# Patient Record
Sex: Male | Born: 1955 | Race: White | Hispanic: No | Marital: Married | Smoking: Current every day smoker
Health system: Southern US, Community
[De-identification: ages and names within clinical notes are randomized; demographics above are authoritative.]

## PROBLEM LIST (undated history)

## (undated) DIAGNOSIS — Z8639 Personal history of other endocrine, nutritional and metabolic disease: Secondary | ICD-10-CM

## (undated) DIAGNOSIS — I890 Lymphedema, not elsewhere classified: Secondary | ICD-10-CM

## (undated) DIAGNOSIS — M545 Low back pain, unspecified: Secondary | ICD-10-CM

## (undated) DIAGNOSIS — E119 Type 2 diabetes mellitus without complications: Secondary | ICD-10-CM

## (undated) DIAGNOSIS — Z720 Tobacco use: Secondary | ICD-10-CM

## (undated) DIAGNOSIS — G8929 Other chronic pain: Secondary | ICD-10-CM

## (undated) DIAGNOSIS — E538 Deficiency of other specified B group vitamins: Secondary | ICD-10-CM

## (undated) HISTORY — DX: Morbid (severe) obesity due to excess calories: E66.01

## (undated) HISTORY — DX: Low back pain, unspecified: M54.50

## (undated) HISTORY — DX: Deficiency of other specified B group vitamins: E53.8

## (undated) HISTORY — DX: Other chronic pain: G89.29

## (undated) HISTORY — PX: WRIST SURGERY: SHX841

## (undated) HISTORY — PX: NECK SURGERY: SHX720

## (undated) HISTORY — DX: Type 2 diabetes mellitus without complications: E11.9

## (undated) HISTORY — DX: Tobacco use: Z72.0

## (undated) HISTORY — DX: Personal history of other endocrine, nutritional and metabolic disease: Z86.39

## (undated) HISTORY — DX: Low back pain: M54.5

---

## 2014-12-12 ENCOUNTER — Other Ambulatory Visit: Payer: Self-pay | Admitting: Family Medicine

## 2014-12-12 ENCOUNTER — Ambulatory Visit
Admission: RE | Admit: 2014-12-12 | Discharge: 2014-12-12 | Disposition: A | Discharge status: Home / Self Care | Payer: No Typology Code available for payment source | Source: Ambulatory Visit | Attending: Family Medicine | Admitting: Family Medicine

## 2014-12-12 DIAGNOSIS — M545 Low back pain, unspecified: Secondary | ICD-10-CM | POA: Insufficient documentation

## 2014-12-12 DIAGNOSIS — G8929 Other chronic pain: Secondary | ICD-10-CM | POA: Insufficient documentation

## 2014-12-12 DIAGNOSIS — R5383 Other fatigue: Secondary | ICD-10-CM | POA: Insufficient documentation

## 2014-12-15 DIAGNOSIS — E538 Deficiency of other specified B group vitamins: Secondary | ICD-10-CM | POA: Insufficient documentation

## 2014-12-15 DIAGNOSIS — E119 Type 2 diabetes mellitus without complications: Secondary | ICD-10-CM | POA: Insufficient documentation

## 2014-12-31 DIAGNOSIS — G5600 Carpal tunnel syndrome, unspecified upper limb: Secondary | ICD-10-CM | POA: Insufficient documentation

## 2015-01-01 ENCOUNTER — Encounter: Payer: No Typology Code available for payment source | Attending: Family Medicine | Admitting: Dietician

## 2015-01-01 ENCOUNTER — Encounter: Payer: Self-pay | Admitting: Dietician

## 2015-01-01 VITALS — BP 110/70 | Ht 69.0 in | Wt 317.3 lb

## 2015-01-01 DIAGNOSIS — F17200 Nicotine dependence, unspecified, uncomplicated: Secondary | ICD-10-CM | POA: Insufficient documentation

## 2015-01-01 DIAGNOSIS — E119 Type 2 diabetes mellitus without complications: Secondary | ICD-10-CM | POA: Diagnosis not present

## 2015-01-01 NOTE — Progress Notes (Signed)
Patient in for refresher program after new diagnosis of Type 2 DM. Patient states he would like to lose weight to better control BGs and prevent other health issues. Instructed pt on general diet guidelines for Diabetes, and basic meal planning using plate method and food models to illustrate balanced meals.  Instructed on goals for BGs and HbA1C.  Discussed effects of elevated BGs on nerve health and circulation. Also discussed effects of smoking, and advised pt to work to quit smoking. He states he would like to quit, but needs to work on one thing at a time.

## 2015-01-01 NOTE — Patient Instructions (Signed)
Eat a meal midday -- a dinner meal, and small supper; or at least a small lunch midday like fruit and peanuts or 1 sandwich with lean meat or peanut butter.  Try white wheat bread.  Control food portions by using smaller plates; eat large portions of vegetables and small portions of meats and starchy foods.  Include protein with all meals -- eggs, lean meat (mostly chicken, [place] or fish), low fat cheese, or peanut butter.

## 2015-01-09 ENCOUNTER — Other Ambulatory Visit: Payer: Self-pay | Admitting: Family Medicine

## 2015-01-09 ENCOUNTER — Ambulatory Visit: Payer: Self-pay | Admitting: Family Medicine

## 2015-01-09 DIAGNOSIS — M545 Low back pain: Secondary | ICD-10-CM

## 2015-01-09 DIAGNOSIS — M79604 Pain in right leg: Secondary | ICD-10-CM

## 2015-01-16 ENCOUNTER — Ambulatory Visit: Payer: No Typology Code available for payment source

## 2015-01-20 ENCOUNTER — Ambulatory Visit
Admission: RE | Admit: 2015-01-20 | Discharge: 2015-01-20 | Disposition: A | Discharge status: Home / Self Care | Payer: No Typology Code available for payment source | Source: Ambulatory Visit | Attending: Family Medicine | Admitting: Family Medicine

## 2015-01-20 DIAGNOSIS — M545 Low back pain: Secondary | ICD-10-CM | POA: Insufficient documentation

## 2015-01-20 DIAGNOSIS — R262 Difficulty in walking, not elsewhere classified: Secondary | ICD-10-CM | POA: Insufficient documentation

## 2015-01-20 DIAGNOSIS — M6281 Muscle weakness (generalized): Secondary | ICD-10-CM | POA: Diagnosis not present

## 2015-01-20 DIAGNOSIS — M79604 Pain in right leg: Secondary | ICD-10-CM

## 2015-01-20 DIAGNOSIS — M79605 Pain in left leg: Secondary | ICD-10-CM

## 2015-02-05 ENCOUNTER — Ambulatory Visit: Payer: No Typology Code available for payment source | Admitting: Dietician

## 2015-02-05 ENCOUNTER — Encounter: Payer: Self-pay | Admitting: Dietician

## 2015-12-11 ENCOUNTER — Ambulatory Visit (INDEPENDENT_AMBULATORY_CARE_PROVIDER_SITE_OTHER): Payer: BLUE CROSS/BLUE SHIELD | Admitting: Family Medicine

## 2015-12-11 ENCOUNTER — Encounter: Payer: Self-pay | Admitting: Family Medicine

## 2015-12-11 VITALS — BP 130/60 | HR 72 | Temp 98.3°F | Ht 69.0 in | Wt 313.4 lb

## 2015-12-11 DIAGNOSIS — R6889 Other general symptoms and signs: Secondary | ICD-10-CM

## 2015-12-11 DIAGNOSIS — M545 Low back pain, unspecified: Secondary | ICD-10-CM

## 2015-12-11 DIAGNOSIS — Z0001 Encounter for general adult medical examination with abnormal findings: Secondary | ICD-10-CM

## 2015-12-11 DIAGNOSIS — E559 Vitamin D deficiency, unspecified: Secondary | ICD-10-CM

## 2015-12-11 DIAGNOSIS — Z6841 Body Mass Index (BMI) 40.0 and over, adult: Secondary | ICD-10-CM

## 2015-12-11 DIAGNOSIS — E538 Deficiency of other specified B group vitamins: Secondary | ICD-10-CM | POA: Diagnosis not present

## 2015-12-11 DIAGNOSIS — J449 Chronic obstructive pulmonary disease, unspecified: Secondary | ICD-10-CM | POA: Diagnosis not present

## 2015-12-11 DIAGNOSIS — E119 Type 2 diabetes mellitus without complications: Secondary | ICD-10-CM | POA: Diagnosis not present

## 2015-12-11 DIAGNOSIS — G8929 Other chronic pain: Secondary | ICD-10-CM | POA: Diagnosis not present

## 2015-12-11 LAB — COMPREHENSIVE METABOLIC PANEL
ALBUMIN: 4 g/dL (ref 3.5–5.2)
ALT: 20 U/L (ref 0–53)
AST: 19 U/L (ref 0–37)
Alkaline Phosphatase: 54 U/L (ref 39–117)
BUN: 20 mg/dL (ref 6–23)
CHLORIDE: 102 meq/L (ref 96–112)
CO2: 27 mEq/L (ref 19–32)
CREATININE: 0.83 mg/dL (ref 0.40–1.50)
Calcium: 9 mg/dL (ref 8.4–10.5)
GFR: 101.01 mL/min (ref >60.00)
Glucose, Bld: 130 mg/dL — ABNORMAL HIGH (ref 70–99)
Potassium: 3.8 mEq/L (ref 3.5–5.1)
SODIUM: 138 meq/L (ref 135–145)
TOTAL PROTEIN: 6.8 g/dL (ref 6.0–8.3)
Total Bilirubin: 0.5 mg/dL (ref 0.2–1.2)

## 2015-12-11 LAB — LIPID PANEL
CHOLESTEROL: 140 mg/dL (ref 0–200)
HDL: 27.2 mg/dL — ABNORMAL LOW (ref >39.00)
LDL CALC: 74 mg/dL (ref 0–99)
NonHDL: 112.3
Total CHOL/HDL Ratio: 5
Triglycerides: 190 mg/dL — ABNORMAL HIGH (ref 0.0–149.0)
VLDL: 38 mg/dL (ref 0.0–40.0)

## 2015-12-11 LAB — CBC
HCT: 45.3 % (ref 39.0–52.0)
Hemoglobin: 15.1 g/dL (ref 13.0–17.0)
MCHC: 33.3 g/dL (ref 30.0–36.0)
MCV: 93 fl (ref 78.0–100.0)
Platelets: 212 10*3/uL (ref 150.0–400.0)
RBC: 4.87 Mil/uL (ref 4.22–5.81)
RDW: 13.2 % (ref 11.5–15.5)
WBC: 8.6 10*3/uL (ref 4.0–10.5)

## 2015-12-11 LAB — MICROALBUMIN / CREATININE URINE RATIO
CREATININE, U: 229.6 mg/dL
Microalb Creat Ratio: 0.6 mg/g (ref 0.0–30.0)
Microalb, Ur: 1.4 mg/dL (ref 0.0–1.9)

## 2015-12-11 LAB — VITAMIN B12: Vitamin B-12: 219 pg/mL (ref 211–911)

## 2015-12-11 LAB — VITAMIN D 25 HYDROXY (VIT D DEFICIENCY, FRACTURES): VITD: 30.44 ng/mL (ref 30.00–100.00)

## 2015-12-11 LAB — HEMOGLOBIN A1C: HEMOGLOBIN A1C: 6.4 % (ref 4.6–6.5)

## 2015-12-11 NOTE — Patient Instructions (Signed)
We will call with your lab results.  Cut back or stop smoking. I am here if you would like medication assistance.  Consider Bariatric surgery. Cut back on sweet tea, portion size.  Follow up in 1 month.  Take care  Dr. Adriana Simasook

## 2015-12-12 ENCOUNTER — Encounter: Payer: Self-pay | Admitting: Family Medicine

## 2015-12-12 DIAGNOSIS — Z6841 Body Mass Index (BMI) 40.0 and over, adult: Secondary | ICD-10-CM

## 2015-12-12 DIAGNOSIS — G5620 Lesion of ulnar nerve, unspecified upper limb: Secondary | ICD-10-CM | POA: Insufficient documentation

## 2015-12-12 DIAGNOSIS — J449 Chronic obstructive pulmonary disease, unspecified: Secondary | ICD-10-CM | POA: Insufficient documentation

## 2015-12-12 DIAGNOSIS — M5090 Cervical disc disorder, unspecified, unspecified cervical region: Secondary | ICD-10-CM | POA: Insufficient documentation

## 2015-12-12 NOTE — Assessment & Plan Note (Signed)
Well controlled on metformin with an A1c of 6.4. Will continue.

## 2015-12-12 NOTE — Assessment & Plan Note (Signed)
Given tobacco abuse and lung exam, patient appears to have COPD. Needs PFT's/pulm referral.

## 2015-12-12 NOTE — Assessment & Plan Note (Signed)
Tetanus and Pneumovax up-to-date. Patient will consider colonoscopy. Labs today. Discussed smoking cessation today. Offered treatment and patient declined. He states that he will quit on his own.

## 2015-12-12 NOTE — Progress Notes (Signed)
Subjective:  Patient ID: Gregory Winters, male    DOB: [birth year]  Age: 60 y.o. MRN: [id number]  CC: Establish care; Back pain  HPI Gregory Winters is a 60 y.o. male presents to the clinic today to establish care.  Preventative Healthcare  Colonoscopy: In need of. Declines at this time. Will consider.  Immunizations  Tetanus - Up to date.  Pneumococcal - Up to date.  Flu - Up to date.  Zoster - N/A.  Prostate cancer screening: Screening last year.   Hepatitis C screening - In need of.  Labs: In need of labs today.   Exercise: Minimal.   Alcohol use: See below.  Smoking/tobacco use: Current every day smoker.  Chronic low back pain  Reports severe low back pain with radiation down the legs.  He reports associated numbness and tingling of the legs.  No bowel or bladder incontinence.  He does note weakness.  He has seen a neurosurgeon previously and was told that he needed to quit smoking and lose weight.  He would like to discuss treatment options today.  Symptoms are exacerbated when standing. He does have some relief with sitting.  No other relieving factors.  Symptoms are severe.  PMH, Surgical Hx, Family Hx, Social History reviewed and updated as below.  Past Medical History  Diagnosis Date  . Diabetes ([place])   . History of vitamin D deficiency   . B12 deficiency     History of  . Morbid obesity ([place])   . Chronic low back pain   . Tobacco abuse     Past Surgical History  Procedure Laterality Date  . Neck surgery  90's    Family History  Problem Relation Age of Onset  . Diabetes Sister   . Cancer    . Hypertension      Social History  Substance Use Topics  . Smoking status: Current Every Day Smoker -- 1.00 packs/day    Types: Cigarettes  . Smokeless tobacco: Not on file  . Alcohol Use: 0.0 - 0.6 oz/week    0-1 Standard drinks or equivalent per week   Review of Systems  Respiratory: Positive for cough.   Genitourinary: Positive  for frequency.       Sexual difficulty.  Musculoskeletal: Positive for back pain.  Neurological: Positive for dizziness, weakness and numbness.  Psychiatric/Behavioral:       Stress.  All other systems reviewed and are negative.  Objective:   Today's Vitals: BP 130/60 mmHg  Pulse 72  Temp(Src) 98.3 F (36.8 C)  Ht _0  (1.753 m)  Wt 313 lb 6.4 oz (142.157 kg)  BMI 46.26 kg/m2  SpO2 90%  Physical Exam  Constitutional: He is oriented to person, place, and time.  Currently ill appearing male. Morbidly obese. Breathing heavily.  HENT:  Head: Normocephalic and atraumatic.  Poor dentition. Multiple dental caries.  Eyes: Conjunctivae are normal. No scleral icterus.  Neck: Neck supple.  Cardiovascular: Normal rate and regular rhythm.   Pulmonary/Chest:  Mild increased work of breathing. Diffuse wheezing noted throughout.  Abdominal: Soft.  Nondistended. Nontender. Large umbilical hernia. Reducible. Appears to be comprised of fatty tissue.  Musculoskeletal:  Lumbar spine - decreased range of motion in all planes. Lower extremities with severe edema.  Neurological: He is alert and oriented to person, place, and time.  Left lower extremity with weakness with dorsiflexion.  Skin: Skin is warm and dry. No rash noted.  Psychiatric: He has a normal mood and affect.  Vitals  reviewed.  Assessment & Plan:   Problem List Items Addressed This Visit    Morbid obesity with BMI of 45.0-49.9, adult ([place])   Relevant Medications   magnesium oxide (MAG-OX) 400 MG tablet   Other Relevant Orders   Comp Met (CMET) (Completed)   Lipid panel (Completed)   Encounter for preventative adult health care exam with abnormal findings - Primary    Tetanus and Pneumovax up-to-date. Patient will consider colonoscopy. Labs today. Discussed smoking cessation today. Offered treatment and patient declined. He states that he will quit on his own.      Diabetes mellitus, type 2 ([place])    Well controlled  on metformin with an A1c of 6.4. Will continue.      Relevant Orders   HgB A1c (Completed)   Urine Microalbumin w/creat. ratio (Completed)   Chronic obstructive pulmonary disease (HCC)    Given tobacco abuse and lung exam, patient appears to have COPD. Needs PFT's/pulm referral.       Relevant Orders   CBC (Completed)   Chronic low back pain    Patient with severe symptoms. He has severe lumbar stenosis and disc disease. He is in need of surgery. However, he is a poor surgical candidate given morbid obesity, tobacco abuse and likely underlying COPD and OSA. Advised weight loss and tobacco cessation. Discussed bariatric surgery and he declines at this time. Follow-up in one month. Needs neurosurgery referral but he is reluctant to do this given the above.      RESOLVED: B12 deficiency   Relevant Orders   B12 (Completed)    Other Visit Diagnoses    Vitamin D deficiency        Relevant Orders    Vitamin D (25 hydroxy) (Completed)       Outpatient Encounter Prescriptions as of 12/11/2015  Medication Sig  . Blood Glucose Monitoring Suppl (ONE TOUCH ULTRA MINI) W/DEVICE KIT   . glucose blood (GLUCOSE METER TEST) test strip   . Lancet Devices ([employer] LANCING DEVICE) MISC   . magnesium oxide (MAG-OX) 400 MG tablet Take by mouth.  . metFORMIN (GLUCOPHAGE) 500 MG tablet Take 500 mg by mouth 2 (two) times daily with a meal.  . Omega-3 Fatty Acids (FISH OIL OMEGA-3 PO) Take by mouth.  . ONE TOUCH ULTRA TEST test strip   . [DISCONTINUED] Vitamin D, Ergocalciferol, (DRISDOL) 50000 UNITS CAPS capsule Take 50,000 Units by mouth once a week.   No facility-administered encounter medications on file as of 12/11/2015.    Follow-up: Return in about 1 month (around 01/11/2016) for Follow up Chronic medical issues.  [place]

## 2015-12-12 NOTE — Assessment & Plan Note (Signed)
Patient with severe symptoms. He has severe lumbar stenosis and disc disease. He is in need of surgery. However, he is a poor surgical candidate given morbid obesity, tobacco abuse and likely underlying COPD and OSA. Advised weight loss and tobacco cessation. Discussed bariatric surgery and he declines at this time. Follow-up in one month. Needs neurosurgery referral but he is reluctant to do this given the above.

## 2016-01-11 ENCOUNTER — Ambulatory Visit (INDEPENDENT_AMBULATORY_CARE_PROVIDER_SITE_OTHER): Payer: BLUE CROSS/BLUE SHIELD | Admitting: Family Medicine

## 2016-01-11 ENCOUNTER — Encounter: Payer: Self-pay | Admitting: Family Medicine

## 2016-01-11 VITALS — BP 150/90 | HR 67 | Temp 97.5°F | Wt 303.0 lb

## 2016-01-11 DIAGNOSIS — J449 Chronic obstructive pulmonary disease, unspecified: Secondary | ICD-10-CM

## 2016-01-11 DIAGNOSIS — Z6841 Body Mass Index (BMI) 40.0 and over, adult: Secondary | ICD-10-CM

## 2016-01-11 DIAGNOSIS — F17210 Nicotine dependence, cigarettes, uncomplicated: Secondary | ICD-10-CM | POA: Diagnosis not present

## 2016-01-11 DIAGNOSIS — M545 Low back pain, unspecified: Secondary | ICD-10-CM

## 2016-01-11 DIAGNOSIS — E119 Type 2 diabetes mellitus without complications: Secondary | ICD-10-CM | POA: Diagnosis not present

## 2016-01-11 DIAGNOSIS — G8929 Other chronic pain: Secondary | ICD-10-CM

## 2016-01-11 NOTE — Patient Instructions (Signed)
Continue the weight loss.  Cut back on the smoking.  We will call regarding your breathing tests.  See you in 3 months.  Take care  Dr. Adriana Simasook

## 2016-01-14 NOTE — Assessment & Plan Note (Signed)
Tobacco cessation counseling  Ask: Patient smoking 2 packs a day.  Advise: Advised strongly to quit.  Assess: Patient appears to be interested in quitting.  Assist: We discussed treatment options today. Patient elected to try and do it on his own.   Arrange follow up: 3 months. Patient and I agreed that if he had not quit over the next 3 months and we would start medication at follow-up.  Time spent: 3 mins

## 2016-01-14 NOTE — Assessment & Plan Note (Addendum)
Patient wants to wait on neurosurgery referral until he continues to lose weight and quits smoking. Advised continued weight loss.

## 2016-01-14 NOTE — Progress Notes (Signed)
Subjective:  Patient ID: Gregory Winters, male    DOB: [birth year]  Age: 60 y.o. MRN: [id number]  CC: Follow up  HPI:  60 year old male with a past medical history of DM 2, morbid obesity, chronic low back pain, tobacco abuse presents for follow-up.  DM-2  Stable, well controlled on Metformin.  Morbid obesity  Patient and his wife made some dietary changes.  He's now lost 10 pounds since his last visit.  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  Activities are limited due to severe pain as well as numbness/tingling in the lower extremities.  Patient in need of neurosurgical evaluation. However, he is at high risk for surgery given comorbidities.  Will discuss referral today and ways to continue to lower risk.  Nicotine dependence/tobacco abuse  Patient has made no improvement in this since our last visit.  He continues to smoke approximately 2 packs a day.  We'll discuss treatment options today.  COPD, presumed  Patient with likely COPD.  Patient in need of PFT's.  Will discuss this again today as he did not want to do this at last visit.  Social Hx   Social History   Social History  . Marital Status: Married    Spouse Name: N/A  . Number of Children: N/A  . Years of Education: N/A   Social History Main Topics  . Smoking status: Current Every Day Smoker -- 1.00 packs/day    Types: Cigarettes  . Smokeless tobacco: None  . Alcohol Use: 0.0 - 0.6 oz/week    0-1 Standard drinks or equivalent per week  . Drug Use: None  . Sexual Activity: Not Asked   Other Topics Concern  . None   Social History Narrative   Review of Systems  Respiratory: Positive for cough and shortness of breath.   Musculoskeletal: Positive for back pain.   Objective:  BP 150/90 mmHg  Pulse 67  Temp(Src) 97.5 F (36.4 C) (Oral)  Wt 303 lb (137.44 kg)  SpO2 91%  BP/Weight 01/11/2016  12/11/2015 01/20/2015  Systolic BP 150 130 -  Diastolic BP 90 60 -  Wt. (Lbs) 303 313.4 317  BMI 44.72 46.26 46.79   Physical Exam  Constitutional: He is oriented to person, place, and time.  Morbidly obese male no acute distress.  Cardiovascular: Normal rate and regular rhythm.   Pulmonary/Chest: He has wheezes.  Mild increased work of breathing.   Neurological: He is alert and oriented to person, place, and time.  Psychiatric: He has a normal mood and affect.  Vitals reviewed.  Lab Results  Component Value Date   WBC 8.6 12/11/2015   HGB 15.1 12/11/2015   HCT 45.3 12/11/2015   PLT 212.0 12/11/2015   GLUCOSE 130* 12/11/2015   CHOL 140 12/11/2015   TRIG 190.0* 12/11/2015   HDL 27.20* 12/11/2015   LDLCALC 74 12/11/2015   ALT 20 12/11/2015   AST 19 12/11/2015   NA 138 12/11/2015   K 3.8 12/11/2015   CL 102 12/11/2015   CREATININE 0.83 12/11/2015   BUN 20 12/11/2015   CO2 27 12/11/2015   HGBA1C 6.4 12/11/2015   MICROALBUR 1.4 12/11/2015    Assessment & Plan:   Problem List Items Addressed This Visit    Chronic low back pain    Patient wants to wait on neurosurgery referral until he continues to lose weight and quits smoking. Advised continued weight loss.  Chronic obstructive pulmonary disease (HCC)    Presumed. Sending for PFTs.      Relevant Orders   Pulmonary function test   Diabetes mellitus, type 2 (HCC) - Primary    Stable. Continue metformin.      Morbid obesity with BMI of 45.0-49.9, adult (HCC)    Congratulated on weight loss.  Advised continued dietary changes to promote weight loss. Patient appears to be motivated.      Nicotine dependence    Tobacco cessation counseling  Ask: Patient smoking 2 packs a day.  Advise: Advised strongly to quit.  Assess: Patient appears to be interested in quitting.  Assist: We discussed treatment options today. Patient elected to try and do it on his own.   Arrange follow up: 3 months. Patient and  I agreed that if he had not quit over the next 3 months and we would start medication at follow-up.  Time spent: 3 mins        Follow-up: Return in about 3 months (around 04/12/2016).  30 minutes were spent face-to-face with the patient during this encounter and ver half of that time was spent on counseling regarding back pain and ways to decrease surgical risk. Discussed weight loss, need for PFT's, need for sleep study.  Everlene Other DO [facility]

## 2016-01-14 NOTE — Assessment & Plan Note (Signed)
Stable.  Continue metformin. 

## 2016-01-14 NOTE — Assessment & Plan Note (Signed)
Congratulated on weight loss.  Advised continued dietary changes to promote weight loss. Patient appears to be motivated.

## 2016-01-14 NOTE — Assessment & Plan Note (Signed)
Presumed. Sending for PFTs.

## 2016-04-15 ENCOUNTER — Ambulatory Visit: Payer: BLUE CROSS/BLUE SHIELD | Admitting: Family Medicine

## 2016-05-09 ENCOUNTER — Ambulatory Visit (INDEPENDENT_AMBULATORY_CARE_PROVIDER_SITE_OTHER): Payer: BLUE CROSS/BLUE SHIELD | Admitting: Family Medicine

## 2016-05-09 ENCOUNTER — Encounter: Payer: Self-pay | Admitting: Family Medicine

## 2016-05-09 VITALS — BP 122/74 | HR 70 | Temp 97.6°F | Wt 296.1 lb

## 2016-05-09 DIAGNOSIS — G8929 Other chronic pain: Secondary | ICD-10-CM

## 2016-05-09 DIAGNOSIS — M5442 Lumbago with sciatica, left side: Secondary | ICD-10-CM | POA: Diagnosis not present

## 2016-05-09 DIAGNOSIS — E119 Type 2 diabetes mellitus without complications: Secondary | ICD-10-CM | POA: Diagnosis not present

## 2016-05-09 DIAGNOSIS — F17210 Nicotine dependence, cigarettes, uncomplicated: Secondary | ICD-10-CM | POA: Diagnosis not present

## 2016-05-09 DIAGNOSIS — E782 Mixed hyperlipidemia: Secondary | ICD-10-CM

## 2016-05-09 DIAGNOSIS — E785 Hyperlipidemia, unspecified: Secondary | ICD-10-CM | POA: Insufficient documentation

## 2016-05-09 DIAGNOSIS — M5116 Intervertebral disc disorders with radiculopathy, lumbar region: Secondary | ICD-10-CM | POA: Insufficient documentation

## 2016-05-09 DIAGNOSIS — M5441 Lumbago with sciatica, right side: Secondary | ICD-10-CM

## 2016-05-09 DIAGNOSIS — M48061 Spinal stenosis, lumbar region without neurogenic claudication: Secondary | ICD-10-CM | POA: Insufficient documentation

## 2016-05-09 DIAGNOSIS — Z23 Encounter for immunization: Secondary | ICD-10-CM

## 2016-05-09 NOTE — Assessment & Plan Note (Signed)
LDL nearly at goal. Patient reluctant about starting the medication. Continue to monitor. Recheck in next visit. We'll discuss statin therapy at that time.

## 2016-05-09 NOTE — Assessment & Plan Note (Signed)
Severe. Had a long discussion with patient today about risks and benefits of surgery. Patient is unsure of what he wants to do. He states that he is worried about not being able to work as he cannot afford it. He does not want surgical referral at this time. Patient following up with me in 3 months.

## 2016-05-09 NOTE — Assessment & Plan Note (Signed)
Advised to quit. Patient adamant that he is not going to smoke anymore after this last carton of cigarettes. Offered medication and he declined.

## 2016-05-09 NOTE — Progress Notes (Signed)
Subjective:  Patient ID: Gregory Winters, male    DOB: 02/07/1956  Age: 60 y.o. MRN: [id number]  CC: Follow up  HPI:  60 year old male with tobacco abuse/nicotine dependence, morbid obesity, DM 2, presumed COPD, chronic low back pain/severe lumbar disc disease and spinal stenosis presents for follow-up.  DM-2  Stable on Metformin.   Tobacco abuse/Nicotine dependence  Still smoking.  We'll discuss today.  Chronic low back pain  Continues to have significant difficulty.  Patient is unsure whether he wants to proceed with surgery.  He would like to discuss this today.  HLD  Stable.   Not on statin.  Patient reluctant about additional medication.  Social Hx  Social History   Social History  . Marital status: Married    Spouse name: N/A  . Number of children: N/A  . Years of education: N/A   Social History Main Topics  . Smoking status: Current Every Day Smoker    Packs/day: 1.00    Types: Cigarettes  . Smokeless tobacco: None  . Alcohol use 0.0 - 0.6 oz/week  . Drug use: Unknown  . Sexual activity: Not Asked   Other Topics Concern  . None   Social History Narrative  . None    Review of Systems  Constitutional: Negative.   Musculoskeletal: Positive for back pain.  Neurological:       Lower extremity numbness/paresthesia.   Objective:  BP 122/74 (BP Location: Right Arm, Patient Position: Sitting, Cuff Size: Large)   Pulse 70   Temp 97.6 F (36.4 C) (Oral)   Wt 296 lb 2 oz (134.3 kg)   SpO2 93%   BMI 43.73 kg/m   BP/Weight 05/09/2016 01/11/2016 12/11/2015  Systolic BP 122 150 130  Diastolic BP 74 90 60  Wt. (Lbs) 296.13 303 313.4  BMI 43.73 44.72 46.26   Physical Exam  Constitutional: He is oriented to person, place, and time.  Morbidly obese male no acute distress.  Cardiovascular: Normal rate and regular rhythm.   Pulmonary/Chest: Effort normal.  Neurological: He is alert and oriented to person, place, and time.  Psychiatric: He has  a normal mood and affect.  Vitals reviewed.  Lab Results  Component Value Date   WBC 8.6 12/11/2015   HGB 15.1 12/11/2015   HCT 45.3 12/11/2015   PLT 212.0 12/11/2015   GLUCOSE 130 (H) 12/11/2015   CHOL 140 12/11/2015   TRIG 190.0 (H) 12/11/2015   HDL 27.20 (L) 12/11/2015   LDLCALC 74 12/11/2015   ALT 20 12/11/2015   AST 19 12/11/2015   NA 138 12/11/2015   K 3.8 12/11/2015   CL 102 12/11/2015   CREATININE 0.83 12/11/2015   BUN 20 12/11/2015   CO2 27 12/11/2015   HGBA1C 6.4 12/11/2015   MICROALBUR 1.4 12/11/2015    Assessment & Plan:   Problem List Items Addressed This Visit    Nicotine dependence    Advised to quit. Patient adamant that he is not going to smoke anymore after this last carton of cigarettes. Offered medication and he declined.      Hyperlipidemia    LDL nearly at goal. Patient reluctant about starting the medication. Continue to monitor. Recheck in next visit. We'll discuss statin therapy at that time.      Diabetes mellitus, type 2 (HCC)    Stable. Continue metformin. Patient is losing weight. Congratulated on success.      Chronic low back pain    Severe. Had a long discussion with patient today about  risks and benefits of surgery. Patient is unsure of what he wants to do. He states that he is worried about not being able to work as he cannot afford it. He does not want surgical referral at this time. Patient following up with me in 3 months.       Other Visit Diagnoses    Encounter for immunization       Relevant Orders   Flu Vaccine QUAD 36+ mos IM (Completed)     Follow-up: Return in about 3 months (around 08/09/2016).  Everlene Other DO [facility]

## 2016-05-09 NOTE — Patient Instructions (Signed)
Continue your medications.  Quit smoking.  Follow up in 3 months.  Take care  Dr. Adriana Simasook

## 2016-05-09 NOTE — Progress Notes (Signed)
Pre visit review using our clinic review tool, if applicable. No additional management support is needed unless otherwise documented below in the visit note. 

## 2016-05-09 NOTE — Assessment & Plan Note (Signed)
Stable. Continue metformin. Patient is losing weight. Congratulated on success.

## 2016-08-12 ENCOUNTER — Ambulatory Visit: Payer: BLUE CROSS/BLUE SHIELD | Admitting: Family Medicine

## 2016-09-10 ENCOUNTER — Other Ambulatory Visit: Payer: Self-pay | Admitting: Orthopedic Surgery

## 2016-09-10 DIAGNOSIS — M48062 Spinal stenosis, lumbar region with neurogenic claudication: Secondary | ICD-10-CM

## 2016-09-16 ENCOUNTER — Other Ambulatory Visit: Payer: BLUE CROSS/BLUE SHIELD

## 2016-09-16 ENCOUNTER — Ambulatory Visit
Admission: RE | Admit: 2016-09-16 | Discharge: 2016-09-16 | Disposition: A | Discharge status: Home / Self Care | Payer: BLUE CROSS/BLUE SHIELD | Source: Ambulatory Visit | Attending: Orthopedic Surgery | Admitting: Orthopedic Surgery

## 2016-09-16 ENCOUNTER — Inpatient Hospital Stay
Admission: RE | Admit: 2016-09-16 | Discharge: 2016-09-16 | Disposition: A | Discharge status: Home / Self Care | Payer: BLUE CROSS/BLUE SHIELD | Source: Ambulatory Visit | Attending: Orthopedic Surgery | Admitting: Orthopedic Surgery

## 2016-09-16 DIAGNOSIS — M48062 Spinal stenosis, lumbar region with neurogenic claudication: Secondary | ICD-10-CM

## 2016-09-16 MED ORDER — DIAZEPAM 5 MG PO TABS
10.0000 mg | ORAL_TABLET | Freq: Once | ORAL | Status: AC
  Administered 2016-09-16: 10 mg via ORAL

## 2016-09-16 MED ORDER — IOPAMIDOL (ISOVUE-M 200) INJECTION 41%
15.0000 mL | Freq: Once | INTRAMUSCULAR | Status: AC
  Administered 2016-09-16: 15 mL via INTRATHECAL

## 2016-09-16 NOTE — Discharge Instructions (Signed)

## 2017-02-13 ENCOUNTER — Ambulatory Visit (INDEPENDENT_AMBULATORY_CARE_PROVIDER_SITE_OTHER): Payer: BLUE CROSS/BLUE SHIELD | Admitting: Family Medicine

## 2017-02-13 ENCOUNTER — Encounter: Payer: Self-pay | Admitting: Family Medicine

## 2017-02-13 VITALS — BP 122/80 | HR 48 | Temp 97.6°F | Wt 311.4 lb

## 2017-02-13 DIAGNOSIS — N6310 Unspecified lump in the right breast, unspecified quadrant: Secondary | ICD-10-CM | POA: Diagnosis not present

## 2017-02-13 NOTE — Progress Notes (Signed)
   Subjective:  Patient ID: Gregory Winters, male    DOB: 09/12/1955  Age: 61 y.o. MRN: [id number]  CC: Mass, breast/pec  HPI:  61 year old male with COPD, DM 2, severe low back pain and spinal stenosis, morbid obesity, hyperlipidemia presents with the above complaint.  Patient states that he has had a right breast mass for the past 6 months or more. Has been painful at times particularly with pressure/palpation. No drainage from the nipple. Mass is quite large. No reports of erythema. No fevers or chills. He is a smoker. No medications or interventions tried. He is concerned that it is an abscess. No other associated symptoms. No other complaints at this time.  Social Hx   Social History   Social History  . Marital status: Married    Spouse name: N/A  . Number of children: N/A  . Years of education: N/A   Social History Main Topics  . Smoking status: Current Every Day Smoker    Packs/day: 1.00    Types: Cigarettes  . Smokeless tobacco: Current User  . Alcohol use 0.0 - 0.6 oz/week  . Drug use: Unknown  . Sexual activity: Not Asked   Other Topics Concern  . None   Social History Narrative  . None   Review of Systems  Constitutional: Negative.   Skin:       Skin changes around the nipple.  Breast - Mass, right.  Objective:  BP 122/80   Pulse (!) 48   Temp 97.6 F (36.4 C) (Oral)   Wt (!) 311 lb 6.4 oz (141.3 kg)   SpO2 95%   BMI 45.99 kg/m   BP/Weight 02/13/2017 09/16/2016 05/09/2016  Systolic BP 122 102 122  Diastolic BP 80 74 74  Wt. (Lbs) 311.4 - 296.13  BMI 45.99 - 43.73    Physical Exam  Constitutional: He is oriented to person, place, and time.  Chronically ill-appearing morbidly obese male no distress.  Pulmonary/Chest: Effort normal. No respiratory distress.  Neurological: He is alert and oriented to person, place, and time.  Psychiatric: His behavior is normal. Thought content normal.  Flat affect.  Breast - Right: Nipple changes noted with  hyperkeratotic tissue surrounding the nipple. Large mass directly under the nipple, measuring ~ 5 x 5 cm.  Lab Results  Component Value Date   WBC 8.6 12/11/2015   HGB 15.1 12/11/2015   HCT 45.3 12/11/2015   PLT 212.0 12/11/2015   GLUCOSE 130 (H) 12/11/2015   CHOL 140 12/11/2015   TRIG 190.0 (H) 12/11/2015   HDL 27.20 (L) 12/11/2015   LDLCALC 74 12/11/2015   ALT 20 12/11/2015   AST 19 12/11/2015   NA 138 12/11/2015   K 3.8 12/11/2015   CL 102 12/11/2015   CREATININE 0.83 12/11/2015   BUN 20 12/11/2015   CO2 27 12/11/2015   HGBA1C 6.4 12/11/2015   MICROALBUR 1.4 12/11/2015    Assessment & Plan:   Problem List Items Addressed This Visit      Other   Breast mass, right - Primary    New problem. Uncertain etiology/prognosis at this time. Given his exam findings, I'm very concerned. Arranging mammogram and ultrasound today.      Relevant Orders   MM Digital Diagnostic Bilat   US BREAST LTD UNI RIGHT INC AXILLA     Follow-up: Pending results  Everlene OtherJayce Aydn Ferrara DO [facility] [place]

## 2017-02-13 NOTE — Assessment & Plan Note (Signed)
New problem. Uncertain etiology/prognosis at this time. Given his exam findings, I'm very concerned. Arranging mammogram and ultrasound today.

## 2017-02-13 NOTE — Patient Instructions (Signed)
I will call with the results as soon as possible  Take care  Dr. Adriana Simasook

## 2019-04-07 ENCOUNTER — Encounter: Payer: Self-pay | Admitting: Emergency Medicine

## 2019-04-07 ENCOUNTER — Ambulatory Visit
Admission: EM | Admit: 2019-04-07 | Discharge: 2019-04-07 | Disposition: A | Discharge status: Other Acute Inpatient Hospital | Payer: Medicare Other | Attending: Family Medicine | Admitting: Family Medicine

## 2019-04-07 ENCOUNTER — Ambulatory Visit: Payer: Medicare Other

## 2019-04-07 ENCOUNTER — Other Ambulatory Visit: Payer: Self-pay

## 2019-04-07 DIAGNOSIS — R0902 Hypoxemia: Secondary | ICD-10-CM | POA: Diagnosis not present

## 2019-04-07 DIAGNOSIS — R0602 Shortness of breath: Secondary | ICD-10-CM | POA: Insufficient documentation

## 2019-04-07 DIAGNOSIS — K5900 Constipation, unspecified: Secondary | ICD-10-CM | POA: Insufficient documentation

## 2019-04-07 DIAGNOSIS — J449 Chronic obstructive pulmonary disease, unspecified: Secondary | ICD-10-CM | POA: Diagnosis not present

## 2019-04-07 LAB — COMPREHENSIVE METABOLIC PANEL
ALT: 20 U/L (ref 0–44)
AST: 20 U/L (ref 15–41)
Albumin: 3.5 g/dL (ref 3.5–5.0)
Alkaline Phosphatase: 64 U/L (ref 38–126)
Anion gap: 9 (ref 5–15)
BUN: 12 mg/dL (ref 8–23)
CO2: 26 mmol/L (ref 22–32)
Calcium: 8.3 mg/dL — ABNORMAL LOW (ref 8.9–10.3)
Chloride: 99 mmol/L (ref 98–111)
Creatinine, Ser: 0.68 mg/dL (ref 0.61–1.24)
GFR calc Af Amer: >60 mL/min (ref >60)
GFR calc non Af Amer: >60 mL/min (ref >60)
Glucose, Bld: 154 mg/dL — ABNORMAL HIGH (ref 70–99)
Potassium: 3.6 mmol/L (ref 3.5–5.1)
Sodium: 134 mmol/L — ABNORMAL LOW (ref 135–145)
Total Bilirubin: 0.8 mg/dL (ref 0.3–1.2)
Total Protein: 6.9 g/dL (ref 6.5–8.1)

## 2019-04-07 LAB — CBC WITH DIFFERENTIAL/PLATELET
Abs Immature Granulocytes: 0.02 10*3/uL (ref 0.00–0.07)
Basophils Absolute: 0 10*3/uL (ref 0.0–0.1)
Basophils Relative: 0 %
Eosinophils Absolute: 0.2 10*3/uL (ref 0.0–0.5)
Eosinophils Relative: 3 %
HCT: 45.1 % (ref 39.0–52.0)
Hemoglobin: 15 g/dL (ref 13.0–17.0)
Immature Granulocytes: 0 %
Lymphocytes Relative: 24 %
Lymphs Abs: 1.4 10*3/uL (ref 0.7–4.0)
MCH: 31.2 pg (ref 26.0–34.0)
MCHC: 33.3 g/dL (ref 30.0–36.0)
MCV: 93.8 fL (ref 80.0–100.0)
Monocytes Absolute: 0.5 10*3/uL (ref 0.1–1.0)
Monocytes Relative: 9 %
Neutro Abs: 3.7 10*3/uL (ref 1.7–7.7)
Neutrophils Relative %: 64 %
Platelets: 160 10*3/uL (ref 150–400)
RBC: 4.81 MIL/uL (ref 4.22–5.81)
RDW: 13.2 % (ref 11.5–15.5)
WBC: 5.7 10*3/uL (ref 4.0–10.5)
nRBC: 0 % (ref 0.0–0.2)

## 2019-04-07 MED ORDER — POLYETHYLENE GLYCOL 3350 17 GM/SCOOP PO POWD: ORAL | 0 refills | Status: AC

## 2019-04-07 MED ORDER — ALBUTEROL SULFATE HFA 108 (90 BASE) MCG/ACT IN AERS: 1.0000 | INHALATION_SPRAY | Freq: Four times a day (QID) | RESPIRATORY_TRACT | 1 refills | Status: AC | PRN

## 2019-04-07 MED ORDER — IPRATROPIUM-ALBUTEROL 0.5-2.5 (3) MG/3ML IN SOLN
3.0000 mL | Freq: Four times a day (QID) | RESPIRATORY_TRACT | Status: DC
  Administered 2019-04-07: 16:00:00 3 mL via RESPIRATORY_TRACT

## 2019-04-07 NOTE — ED Provider Notes (Signed)
[facility]    CSN: [id number] Arrival date & time: 04/07/19  1406  History   Chief Complaint Chief Complaint  Patient presents with  . Shortness of Breath   HPI  63 year old male with a complicated past medical history including breast cancer, obesity, spinal stenosis, tobacco abuse, and COPD presents with shortness of breath and fatigue.  Patient states that for the past few weeks he has had ongoing shortness of breath and fatigue.  Wife states that this has been going on for a month or more.  He seems to be more short of breath at night when he lies flat and also when he exerts himself.  He is fairly sedentary at this time.  He continues to smoke.  He states that he smokes approximately 1 pack a day.  Patient states that he has little energy.  No fever.  No reports of cough.  No relieving factors.  Patient also reports that he has had ongoing constipation.  He states that his abdomen feels "tight".  He does not have a regular daily bowel movement.  He states his last bowel movement was earlier this week.  No other associated symptoms.  No other complaints.  PMH, Surgical Hx, Family Hx, Social History reviewed and updated as below.  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 Breast mass, right 02/13/2017  . Lumbar disc disease with radiculopathy 05/09/2016  . Spinal stenosis of lumbar region 05/09/2016  . Hyperlipidemia 05/09/2016  . Morbid obesity with BMI of 45.0-49.9, adult ([place]) 12/12/2015  . Chronic obstructive pulmonary disease ([place]) 12/12/2015  . Cubital tunnel syndrome  12/12/2015  . Cervical disc disease 12/12/2015  . Nicotine dependence 01/01/2015  . Carpal tunnel syndrome 12/31/2014  . Diabetes mellitus, type 2 ([place]) 12/15/2014  . Chronic low back pain 12/12/2014   Surgical Hx: PR MASTECTOMY, SIMPLE, COMPLETE 03/05/2017 Right Procedure: MASTECTOMY, SIMPLE, COMPLETE; Surgeon: Cornelia Copa, DO; Location: ASC OR [facility]; Service: Surgical Oncology   PR BX/REMV,LYMPH NODE,DEEP AXILL 03/05/2017 Right Procedure: BX/EXC LYMPH NODE; OPEN, DEEP AXILRY NODE; Surgeon: Cornelia Copa, DO; Location: ASC OR [facility]; Service: Surgical Oncology   PR INTRAOPERATIVE SENTINEL LYMPH NODE ID W DYE INJECTION 03/05/2017 Right Procedure: INTRAOPERATIVE IDENTIFICATION SENTINEL LYMPH NODE(S) INCLUDE INJECTION NON-RADIOACTIVE DYE, WHEN PERFORMED; Surgeon: Cornelia Copa, DO; Location: ASC OR [facility]; Service: Surgical Oncology   PR REMOVE ARMPITS LYMPH NODES COMPLT 03/27/2017 Axilla/Right Procedure: Axillary Lymphadenectomy; Complete; Surgeon: Cornelia Copa, DO; Location: MAIN OR [facility]; Service: Surgical Oncology   PR INTRAOPERATIVE SENTINEL LYMPH NODE ID W DYE INJECTION 03/27/2017 Axilla/Right Procedure: Intraoperative Identification Sentinel Lymph Node(S) Include Injection Non-Radioactive Dye, When Performed; Surgeon: Cornelia Copa, DO; Location: MAIN OR [place]; Service: Surgical Oncology   IR INSERT PORT AGE GREATER THAN 5 YRS 07/22/2017  IR INSERT PORT AGE GREATER THAN 5 YRS 07/22/2017 Solmon Ice, MD IMG VIR [facility]   MASTECTOMY 01/25/2017 - 02/24/2017 Right    BREAST BIOPSY 07/28/2016 - 07/27/2017 Right br ca    Penile prosthesis   Home Medications    Prior to Admission medications  Medication Sig Start Date End Date Taking? Authorizing Provider  albuterol (VENTOLIN HFA) 108 (90 Base) MCG/ACT inhaler Inhale 1-2 puffs into the lungs every 6 (six) hours as needed for wheezing or shortness of breath. 04/07/19   Coral Spikes, DO  Blood Glucose  Monitoring Suppl (ONE TOUCH ULTRA MINI) W/DEVICE KIT  12/14/14   [provider]  magnesium oxide (MAG-OX) 400 MG tablet Take by mouth.    [provider]  metFORMIN (GLUCOPHAGE) 500 MG tablet Take 500 mg by mouth 2 (two) times daily with a meal. 12/14/14   [provider]  Omega-3 Fatty Acids (FISH OIL OMEGA-3 PO) Take by mouth.    [provider]  ONE TOUCH ULTRA TEST test strip  12/14/14   [provider]  polyethylene glycol powder (GLYCOLAX/MIRALAX) 17 GM/SCOOP powder 17 g once or twice daily for constipation. 04/07/19   Coral Spikes, DO   Family History Family History  Problem Relation Age of Onset  . Diabetes Sister   . Cancer Other   . Hypertension Other    Social History Social History   Tobacco Use  . Smoking status: Current Every Day Smoker    Packs/day: 1.00    Types: Cigarettes  . Smokeless tobacco: Current User  Substance Use Topics  . Alcohol use: Yes    Alcohol/week: 0.0 - 1.0 standard drinks  . Drug use: Not on file    Allergies   Patient has no known allergies.   Review of Systems Review of Systems  Constitutional: Positive for fatigue.  Respiratory: Positive for shortness of breath.    Physical Exam Triage Vital Signs ED Triage Vitals  Enc Vitals Group     BP 04/07/19 1420 117/65     Pulse Rate 04/07/19 1420 70     Resp 04/07/19 1420 20     Temp 04/07/19 1420 97.8 F (36.6 C)     Temp Source 04/07/19 1420 Oral     SpO2 04/07/19 1420 (!) 89 %     Weight 04/07/19 1424 300 lb (136.1 kg)     Height 04/07/19 1424 '5\' 9"'  (1.753 m)     Head Circumference --      Peak Flow --      Pain Score 04/07/19 1424 0     Pain Loc --      Pain Edu? --      Excl. in [place]? --    Updated Vital Signs BP 117/65 (BP Location: Left Arm)   Pulse 70   Temp 97.8 F (36.6 C) (Oral)   Resp 20   Ht '5\' 9"'  (1.753 m)   Wt 136.1 kg   SpO2 (!) 89%   BMI 44.30 kg/m   Visual Acuity Right Eye Distance:   Left Eye Distance:    Bilateral Distance:    Right Eye Near:   Left Eye Near:    Bilateral Near:     Physical Exam Vitals signs and nursing note reviewed.  Constitutional:      Appearance: He is not toxic-appearing.     Comments: Morbidly obese male in no apparent distress.  HENT:     Head: Normocephalic and atraumatic.  Eyes:     General:        Right eye: No discharge.        Left eye: No discharge.     Conjunctiva/sclera: Conjunctivae normal.  Cardiovascular:     Rate and Rhythm: Normal rate and regular rhythm.     Heart sounds: No  murmur.  Pulmonary:     Comments: No increased work of breathing.  No appreciable wheezing on exam. Abdominal:     Palpations: Abdomen is soft.     Comments: Protuberant abdomen.  Large fat-containing umbilical hernia.  Neurological:     Mental Status: He is alert.  Psychiatric:        Mood and Affect: Mood normal.        Behavior: Behavior normal.    UC Treatments / Results  Labs (all labs ordered are listed, but only abnormal results are displayed) Labs Reviewed  COMPREHENSIVE METABOLIC PANEL - Abnormal; Notable for the following components:      Result Value   Sodium 134 (*)    Glucose, Bld 154 (*)    Calcium 8.3 (*)    All other components within normal limits  CBC WITH DIFFERENTIAL/PLATELET    EKG   Radiology Dg Chest 2 View  Result Date: 04/07/2019 CLINICAL DATA:  Shortness of breath EXAM: CHEST - 2 VIEW COMPARISON:  None. FINDINGS: There is an abundant epicardial fat pad. Cardiomediastinal contours are otherwise unremarkable. Atelectatic changes are present in the lung bases. No focal consolidative process. No pneumothorax or visible effusion. Degenerative changes are present in the imaged spine and shoulders. IMPRESSION: 1. Bibasilar atelectasis. 2. Lungs otherwise clear. Electronically Signed   By: Lovena Le M.D.   On: 04/07/2019 16:04   Dg Abd 1 View  Result Date: 04/07/2019 CLINICAL DATA:  Shortness of breath, weakness, hypertension  EXAM: ABDOMEN - 1 VIEW COMPARISON:  None. FINDINGS: The bowel gas pattern is normal. Punctate calcification in the left upper quadrant overlies the left renal shadow. Radiodense apparatus in the pelvis, possibly reservoir for prosthesis. Additional phleboliths are noted in the pelvis. Multilevel degenerative changes are present in the imaged portions of the spine. Additional degenerative features present in the SI joints and hips. Enthesopathic changes noted on the iliac crests. IMPRESSION: Nonobstructive bowel gas pattern. Left nephrolithiasis. Radiopaque reservoir in the low pelvis, correlate with patient history. Electronically Signed   By: Lovena Le M.D.   On: 04/07/2019 16:06    Procedures Procedures (including critical care time)  Medications Ordered in UC Medications  ipratropium-albuterol (DUONEB) 0.5-2.5 (3) MG/3ML nebulizer solution 3 mL (3 mLs Nebulization Given 04/07/19 1601)    Initial Impression / Assessment and Plan / UC Course  I have reviewed the triage vital signs and the nursing notes.  Pertinent labs & imaging results that were available during my care of the patient were reviewed by me and considered in my medical decision making (see chart for details).    63 year old male presents with hypoxia, shortness of breath.  Also experiencing constipation.  Labs stable.  Chest x-ray with basilar atelectasis.  KUB unremarkable.  Patient given a DuoNeb without significant improvement and oxygen saturation.  Discussed patient with hematology/oncology.  Concern for possible PE.  Sending patient to [facility] for evaluation and imaging.  Final Clinical Impressions(s) / UC Diagnoses   Final diagnoses:  Constipation  SOB (shortness of breath)  Hypoxia     Discharge Instructions     Patient with mild hypoxia - 88-89% or room air.  Chest xray negative.  Labs stable.  Needs CT angio for possible PE.  Dr. Lacinda Axon    ED Prescriptions    Medication Sig Dispense Auth.  Provider   polyethylene glycol powder (GLYCOLAX/MIRALAX) 17 GM/SCOOP powder 17 g once or twice daily for constipation. 500 g Orris Perin G, DO   albuterol (VENTOLIN HFA)  108 (90 Base) MCG/ACT inhaler Inhale 1-2 puffs into the lungs every 6 (six) hours as needed for wheezing or shortness of breath. 18 g Coral Spikes, DO     Controlled Substance Prescriptions [place] Controlled Substance Registry consulted? Not Applicable   Coral Spikes, DO 04/07/19 1715

## 2019-04-07 NOTE — Discharge Instructions (Signed)
Patient with mild hypoxia - 88-89% or room air.  Chest xray negative.  Labs stable.  Needs CT angio for possible PE.  Dr. Lacinda Axon

## 2019-04-07 NOTE — ED Triage Notes (Signed)
Patient state he is very tired and weak and "give out".  Patients wife states patients BP was elevated at the last visit

## 2019-04-08 MED ORDER — GENERIC EXTERNAL MEDICATION
Status: DC
Start: ? — End: 2019-04-08

## 2019-04-08 MED ORDER — HEPARIN (PORCINE) IN NACL 25000-0.45 UT/250ML-% IV SOLN
18.00 | INTRAVENOUS | Status: DC
Start: ? — End: 2019-04-08

## 2019-04-08 MED ORDER — GENERIC EXTERNAL MEDICATION
2.00 | Status: DC
Start: ? — End: 2019-04-08

## 2019-04-08 MED ORDER — ONDANSETRON 4 MG PO TBDP
4.00 | ORAL_TABLET | ORAL | Status: DC
Start: ? — End: 2019-04-08

## 2019-04-08 MED ORDER — TAMOXIFEN CITRATE 10 MG PO TABS
20.00 | ORAL_TABLET | ORAL | Status: DC
Start: 2019-04-11 — End: 2019-04-08

## 2019-04-08 MED ORDER — MELATONIN 3 MG PO TABS
3.00 | ORAL_TABLET | ORAL | Status: DC
Start: ? — End: 2019-04-08

## 2019-04-08 MED ORDER — HEPARIN SODIUM (PORCINE) 1000 UNIT/ML IJ SOLN
5000.00 | INTRAMUSCULAR | Status: DC
Start: ? — End: 2019-04-08

## 2019-04-08 MED ORDER — ACETAMINOPHEN 325 MG PO TABS
650.00 | ORAL_TABLET | ORAL | Status: DC
Start: ? — End: 2019-04-08

## 2019-04-08 MED ORDER — POLYETHYLENE GLYCOL 3350 17 G PO PACK
17.00 | PACK | ORAL | Status: DC
Start: 2019-04-11 — End: 2019-04-08

## 2019-04-10 MED ORDER — GENERIC EXTERNAL MEDICATION
Status: DC
Start: ? — End: 2019-04-10

## 2019-05-10 ENCOUNTER — Telehealth: Payer: Self-pay | Admitting: Family Medicine

## 2019-05-10 MED ORDER — FUROSEMIDE 20 MG PO TABS: 20.0000 mg | ORAL_TABLET | Freq: Every day | ORAL | 0 refills | Status: AC

## 2019-05-10 NOTE — Telephone Encounter (Signed)
Former primary care patient of mine. Recently admitted for hypoxia and found to have bilateral PE. Currently on Eliquis. Spoke to wife tonight. She states that they were informed that I was NOT working at [facility] today (which is incorrect).  They were upset about the miscommunication. She reports he has been experiencing LE edema and has a healing wound (from a cut 2 weeks ago).  SOB improving.  They will be coming in for evaluation on Thursday.  Sending in  lasix for edema (trial of). Labs reviewed  [facility]

## 2019-05-12 ENCOUNTER — Ambulatory Visit
Admission: EM | Admit: 2019-05-12 | Discharge: 2019-05-12 | Disposition: A | Discharge status: Home / Self Care | Payer: Medicare Other | Attending: Family Medicine | Admitting: Family Medicine

## 2019-05-12 ENCOUNTER — Encounter: Payer: Self-pay | Admitting: Emergency Medicine

## 2019-05-12 ENCOUNTER — Other Ambulatory Visit: Payer: Self-pay

## 2019-05-12 DIAGNOSIS — H60391 Other infective otitis externa, right ear: Secondary | ICD-10-CM | POA: Diagnosis not present

## 2019-05-12 DIAGNOSIS — S81801A Unspecified open wound, right lower leg, initial encounter: Secondary | ICD-10-CM

## 2019-05-12 DIAGNOSIS — L03115 Cellulitis of right lower limb: Secondary | ICD-10-CM

## 2019-05-12 HISTORY — DX: Lymphedema, not elsewhere classified: I89.0

## 2019-05-12 MED ORDER — DOXYCYCLINE HYCLATE 100 MG PO CAPS: 100.0000 mg | ORAL_CAPSULE | Freq: Two times a day (BID) | ORAL | 0 refills | Status: AC

## 2019-05-12 MED ORDER — NEOMYCIN-POLYMYXIN-HC 3.5-10000-1 OT SUSP: 4.0000 [drp] | Freq: Three times a day (TID) | OTIC | 0 refills | Status: AC

## 2019-05-12 NOTE — Discharge Instructions (Signed)
Antibiotics as prescribed.  Wound care will call regarding his appt.  Call here and ask for Ria Comment or Nira Conn with concerns.  Take care  Dr. Lacinda Axon

## 2019-05-12 NOTE — ED Provider Notes (Signed)
[facility]    CSN: [id number] Arrival date & time: 05/12/19  1335      History   Chief Complaint Chief Complaint  Patient presents with  . Leg Pain    right   HPI  63 year old male with a complicated past medical history presents with with multiple issues.  Patient is a former patient of mine from primary care.  Patient and caregiver report ongoing lower extremity edema.  Chronic.  Does not use compression hose.  Seems to be increasing as of late.  Approximate 2 weeks ago, patient suffered a fall and injured his right lower leg.  Patient has an open wound on his right anterior lower leg.  He has been applying some topical agents without resolution.  Surrounding area is warm to the touch.  It is draining.  No reported fever or chills.  Patient recently seen here and subsequently sent to the ER and was found to have bilateral pulmonary emboli.  He is currently doing well on Eliquis.  He has stopped his tamoxifen as directed by hematology/oncology given recent pulmonary embolus.  He is followed closely by hematology/oncology.  Lastly, patient recently got water in his right ear.  He reports itching and irritation of the right ear.  His caregiver would like me to examine his ear today.  PMH, Surgical Hx, Family Hx, Social History reviewed and updated as below.  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 Breast mass, right 02/13/2017  . Lumbar disc disease with radiculopathy 05/09/2016  . Spinal stenosis of lumbar region 05/09/2016  . Hyperlipidemia 05/09/2016  . Morbid obesity with BMI of 45.0-49.9, adult ([place]) 12/12/2015  . Chronic obstructive pulmonary disease ([place]) 12/12/2015   . Cubital tunnel syndrome 12/12/2015  . Cervical disc disease 12/12/2015  . Nicotine dependence 01/01/2015  . Carpal tunnel syndrome 12/31/2014  . Diabetes mellitus, type 2 ([place]) 12/15/2014  . Chronic low back pain 12/12/2014   Surgical Hx:  PR MASTECTOMY, SIMPLE, COMPLETE 03/05/2017 Right Procedure: MASTECTOMY, SIMPLE, COMPLETE; Surgeon: Cornelia Copa, DO; Location: ASC [facility]; Service: Surgical Oncology   PR BX/REMV,LYMPH NODE,DEEP AXILL 03/05/2017 Right Procedure: BX/EXC LYMPH NODE; OPEN, DEEP AXILRY NODE; Surgeon: Cornelia Copa, DO; Location: ASC OR [facility]; Service: Surgical Oncology   PR INTRAOPERATIVE SENTINEL LYMPH NODE ID W DYE INJECTION 03/05/2017 Right Procedure: INTRAOPERATIVE IDENTIFICATION SENTINEL LYMPH NODE(S) INCLUDE INJECTION NON-RADIOACTIVE DYE, WHEN PERFORMED; Surgeon: Cornelia Copa, DO; Location: ASC OR [facility]; Service: Surgical Oncology   PR REMOVE ARMPITS LYMPH NODES COMPLT 03/27/2017 Axilla/Right Procedure: Axillary Lymphadenectomy; Complete; Surgeon: Cornelia Copa, DO; Location: MAIN [facility]; Service: Surgical Oncology   PR INTRAOPERATIVE SENTINEL LYMPH NODE ID W DYE INJECTION 03/27/2017 Axilla/Right Procedure: Intraoperative Identification Sentinel Lymph Node(S) Include Injection Non-Radioactive Dye, When Performed; Surgeon: Cornelia Copa, DO; Location: MAIN OR [place]; Service: Surgical Oncology   IR INSERT PORT AGE GREATER THAN 5 YRS 07/22/2017  IR INSERT PORT AGE GREATER THAN 5 YRS 07/22/2017 Solmon Ice, MD IMG VIR [facility]   MASTECTOMY 01/25/2017 - 02/24/2017 Right    BREAST BIOPSY 07/28/2016 - 07/27/2017 Right br ca      Home Medications    Prior to Admission medications   Medication Sig Start Date End Date Taking? Authorizing Provider  albuterol (VENTOLIN HFA) 108 (  90 Base) MCG/ACT inhaler Inhale 1-2 puffs into the lungs every 6 (six) hours as needed for wheezing or shortness of breath. 04/07/19  Yes Aydia Maj G, DO  apixaban  (ELIQUIS) 5 MG TABS tablet Take by mouth. 04/10/19 05/16/19 Yes [provider]  furosemide (LASIX) 20 MG tablet Take 1 tablet (20 mg total) by mouth daily. 05/10/19  Yes Curren Mohrmann G, DO  magnesium oxide (MAG-OX) 400 MG tablet Take by mouth.   Yes [provider]  polyethylene glycol powder (GLYCOLAX/MIRALAX) 17 GM/SCOOP powder 17 g once or twice daily for constipation. 04/07/19  Yes Kirstein Baxley G, DO  Blood Glucose Monitoring Suppl (ONE TOUCH ULTRA MINI) W/DEVICE KIT  12/14/14   [provider]  doxycycline (VIBRAMYCIN) 100 MG capsule Take 1 capsule (100 mg total) by mouth 2 (two) times daily. 05/12/19   Coral Spikes, DO  neomycin-polymyxin-hydrocortisone (CORTISPORIN) 3.5-10000-1 OTIC suspension Place 4 drops into the right ear 3 (three) times daily for 7 days. 05/12/19 05/19/19  Coral Spikes, DO  ONE TOUCH ULTRA TEST test strip  12/14/14   [provider]  metFORMIN (GLUCOPHAGE) 500 MG tablet Take 500 mg by mouth 2 (two) times daily with a meal. 12/14/14 05/12/19  [provider]    Family History Family History  Problem Relation Age of Onset  . Diabetes Sister   . Cancer Other   . Hypertension Other   . Allergies Mother   . Hyperlipidemia Mother   . Clotting disorder Mother   . Lung cancer Father   . Hypertension Father     Social History Social History   Tobacco Use  . Smoking status: Current Every Day Smoker    Packs/day: 1.00    Years: 45.00    Pack years: 45.00    Types: Cigarettes  . Smokeless tobacco: Former Systems developer    Quit date: 05/11/2017  Substance Use Topics  . Alcohol use: Not Currently    Alcohol/week: 0.0 - 1.0 standard drinks  . Drug use: Never   Allergies   Patient has no known allergies.  Review of Systems Review of Systems  HENT:       Ear itching.  Cardiovascular: Positive for leg swelling.  Musculoskeletal: Positive for back pain.  Skin: Positive for wound.   Physical Exam Triage Vital Signs ED Triage  Vitals  Enc Vitals Group     BP 05/12/19 1355 134/78     Pulse Rate 05/12/19 1355 63     Resp 05/12/19 1355 20     Temp 05/12/19 1355 97.8 F (36.6 C)     Temp Source 05/12/19 1355 Oral     SpO2 05/12/19 1355 96 %     Weight 05/12/19 1358 (!) 320 lb (145.2 kg)     Height 05/12/19 1358 '5\' 9"'  (1.753 m)     Head Circumference --      Peak Flow --      Pain Score 05/12/19 1358 0     Pain Loc --      Pain Edu? --      Excl. in [place]? --    Updated Vital Signs BP 134/78 (BP Location: Left Arm)   Pulse 63   Temp 97.8 F (36.6 C) (Oral)   Resp 20   Ht '5\' 9"'  (1.753 m)   Wt (!) 145.2 kg   SpO2 96%   BMI 47.26 kg/m   Visual Acuity Right Eye Distance:   Left Eye Distance:   Bilateral Distance:  Right Eye Near:   Left Eye Near:    Bilateral Near:     Physical Exam Vitals signs and nursing note reviewed.  Constitutional:      General: He is not in acute distress.    Appearance: He is obese.     Comments: Chronically ill-appearing.  HENT:     Head: Normocephalic and atraumatic.     Ears:     Comments: Right ear -canal with erythema and debris. Eyes:     General:        Right eye: No discharge.        Left eye: No discharge.     Conjunctiva/sclera: Conjunctivae normal.  Cardiovascular:     Rate and Rhythm: Normal rate and regular rhythm.  Pulmonary:     Effort: Pulmonary effort is normal. No respiratory distress.  Skin:         Comments: Triangular-shaped open lesion noted at the level location.  Measures 3.5 x 3 cm.  Surrounding warmth and erythema.  Foul-smelling drainage.  Neurological:     Mental Status: He is alert.  Psychiatric:        Mood and Affect: Mood normal.        Behavior: Behavior normal.    UC Treatments / Results  Labs (all labs ordered are listed, but only abnormal results are displayed) Labs Reviewed - No data to display  EKG   Radiology No results found.  Procedures Procedures (including critical care time)  Medications Ordered in  UC Medications - No data to display  Initial Impression / Assessment and Plan / UC Course  I have reviewed the triage vital signs and the nursing notes.  Pertinent labs & imaging results that were available during my care of the patient were reviewed by me and considered in my medical decision making (see chart for details).    63 year old male presents with several issues.  Open wound and cellulitis.  Referring to wound care.  Placing on doxycycline.  Patient also found to have otitis externa.  Placing on Cortisporin otic.  Regarding his lower extremity edema, this is primarily from sedentary lifestyle and venous stasis.  Patient and caregiver wanted to try a trial of Lasix.  Rx has already been sent.  Final Clinical Impressions(s) / UC Diagnoses   Final diagnoses:  Cellulitis of right lower extremity  Leg wound, right, initial encounter  Infective otitis externa of right ear     Discharge Instructions     Antibiotics as prescribed.  Wound care will call regarding his appt.  Call here and ask for Ria Comment or Nira Conn with concerns.  Take care  Dr. Lacinda Axon     ED Prescriptions    Medication Sig Dispense Auth. Provider   neomycin-polymyxin-hydrocortisone (CORTISPORIN) 3.5-10000-1 OTIC suspension Place 4 drops into the right ear 3 (three) times daily for 7 days. 10 mL Trenten Watchman G, DO   doxycycline (VIBRAMYCIN) 100 MG capsule Take 1 capsule (100 mg total) by mouth 2 (two) times daily. 14 capsule Thersa Salt G, DO     PDMP not reviewed this encounter.   Coral Spikes, [place] 05/12/19 1514

## 2019-05-12 NOTE — ED Triage Notes (Signed)
Patient in today c/o right leg pain after falling ~2 weeks ago. Patient also here to recheck his breathing his 04/07/19 visit.

## 2019-05-18 ENCOUNTER — Ambulatory Visit: Payer: Medicare Other | Admitting: Internal Medicine

## 2019-10-26 MED ORDER — ONDANSETRON HCL 4 MG/2ML IJ SOLN
4.00 | INTRAMUSCULAR | Status: DC
Start: ? — End: 2019-10-26

## 2019-10-26 MED ORDER — BIOTENE DRY MOUTH MOISTURIZING MT SOLN
1.00 | OROMUCOSAL | Status: DC
Start: ? — End: 2019-10-26

## 2019-10-26 MED ORDER — GENERIC EXTERNAL MEDICATION
Status: DC
Start: ? — End: 2019-10-26

## 2019-10-26 MED ORDER — SODIUM CHLORIDE 3 % IN NEBU
4.00 | INHALATION_SOLUTION | RESPIRATORY_TRACT | Status: DC
Start: 2019-10-27 — End: 2019-10-26

## 2019-10-26 MED ORDER — ACETAMINOPHEN 325 MG PO TABS
650.00 | ORAL_TABLET | ORAL | Status: DC
Start: ? — End: 2019-10-26

## 2019-10-26 MED ORDER — DERMACERIN EX CREA
TOPICAL_CREAM | CUTANEOUS | Status: DC
Start: ? — End: 2019-10-26

## 2019-10-26 MED ORDER — MENTHOL 9.1 MG MT LOZG
1.00 | LOZENGE | OROMUCOSAL | Status: DC
Start: ? — End: 2019-10-26

## 2019-10-26 MED ORDER — LIDOCAINE VISCOUS HCL 2 % MT SOLN
15.00 | OROMUCOSAL | Status: DC
Start: ? — End: 2019-10-26

## 2019-10-26 MED ORDER — ANASTROZOLE 1 MG PO TABS
1.00 | ORAL_TABLET | ORAL | Status: DC
Start: 2019-10-27 — End: 2019-10-26

## 2019-10-26 MED ORDER — FENTANYL CITRATE (PF) 50 MCG/ML IJ SOLN
50.00 | INTRAMUSCULAR | Status: DC
Start: ? — End: 2019-10-26

## 2019-10-26 MED ORDER — IPRATROPIUM-ALBUTEROL 0.5-2.5 (3) MG/3ML IN SOLN
3.00 | RESPIRATORY_TRACT | Status: DC
Start: 2019-10-26 — End: 2019-10-26

## 2019-10-26 MED ORDER — DSS 100 MG PO CAPS
100.00 | ORAL_CAPSULE | ORAL | Status: DC
Start: 2019-10-26 — End: 2019-10-26

## 2019-10-26 MED ORDER — GABAPENTIN 300 MG PO CAPS
300.00 | ORAL_CAPSULE | ORAL | Status: DC
Start: 2019-10-26 — End: 2019-10-26

## 2019-10-26 MED ORDER — APIXABAN 5 MG PO TABS
5.00 | ORAL_TABLET | ORAL | Status: DC
Start: 2019-10-26 — End: 2019-10-26

## 2019-10-26 MED ORDER — CHLORHEXIDINE GLUCONATE 0.12 % MT SOLN
5.00 | OROMUCOSAL | Status: DC
Start: 2019-10-26 — End: 2019-10-26

## 2019-10-26 MED ORDER — SENNOSIDES 8.6 MG PO TABS
2.00 | ORAL_TABLET | ORAL | Status: DC
Start: 2019-10-27 — End: 2019-10-26

## 2019-11-23 MED ORDER — MELATONIN 3 MG PO TABS
3.00 | ORAL_TABLET | ORAL | Status: DC
Start: 2019-11-23 — End: 2019-11-23

## 2019-11-23 MED ORDER — SIMETHICONE 80 MG PO CHEW
80.00 | CHEWABLE_TABLET | ORAL | Status: DC
Start: ? — End: 2019-11-23

## 2019-11-23 MED ORDER — TRAZODONE HCL 50 MG PO TABS
50.00 | ORAL_TABLET | ORAL | Status: DC
Start: ? — End: 2019-11-23

## 2019-11-23 MED ORDER — FUROSEMIDE 40 MG PO TABS
40.00 | ORAL_TABLET | ORAL | Status: DC
Start: 2019-11-24 — End: 2019-11-23

## 2019-11-23 MED ORDER — LIDOCAINE VISCOUS HCL 2 % MT SOLN
15.00 | OROMUCOSAL | Status: DC
Start: ? — End: 2019-11-23

## 2019-11-23 MED ORDER — MENTHOL 9.1 MG MT LOZG
1.00 | LOZENGE | OROMUCOSAL | Status: DC
Start: ? — End: 2019-11-23

## 2019-11-23 MED ORDER — ACETAMINOPHEN 325 MG PO TABS
650.00 | ORAL_TABLET | ORAL | Status: DC
Start: ? — End: 2019-11-23

## 2019-11-23 MED ORDER — SENNOSIDES 8.6 MG PO TABS
3.00 | ORAL_TABLET | ORAL | Status: DC
Start: 2019-11-24 — End: 2019-11-23

## 2019-11-23 MED ORDER — APIXABAN 5 MG PO TABS
5.00 | ORAL_TABLET | ORAL | Status: DC
Start: 2019-11-23 — End: 2019-11-23

## 2019-11-23 MED ORDER — POLYETHYLENE GLYCOL 3350 17 GM/SCOOP PO POWD
17.00 | ORAL | Status: DC
Start: 2019-11-24 — End: 2019-11-23

## 2019-11-23 MED ORDER — ANASTROZOLE 1 MG PO TABS
1.00 | ORAL_TABLET | ORAL | Status: DC
Start: 2019-11-24 — End: 2019-11-23

## 2019-11-23 MED ORDER — IPRATROPIUM-ALBUTEROL 0.5-2.5 (3) MG/3ML IN SOLN
3.00 | RESPIRATORY_TRACT | Status: DC
Start: ? — End: 2019-11-23

## 2019-11-23 MED ORDER — GENERIC EXTERNAL MEDICATION
Status: DC
Start: ? — End: 2019-11-23

## 2019-11-23 MED ORDER — DERMACERIN EX CREA
TOPICAL_CREAM | CUTANEOUS | Status: DC
Start: ? — End: 2019-11-23

## 2019-11-23 MED ORDER — CALCIUM CARBONATE 1250 (500 CA) MG PO CHEW
CHEWABLE_TABLET | ORAL | Status: DC
Start: ? — End: 2019-11-23

## 2019-11-23 MED ORDER — DICLOFENAC SODIUM 1 % EX GEL
2.00 | CUTANEOUS | Status: DC
Start: 2019-11-23 — End: 2019-11-23

## 2019-11-23 MED ORDER — GUAIFENESIN 100 MG/5ML PO SYRP
100.00 | ORAL_SOLUTION | ORAL | Status: DC
Start: ? — End: 2019-11-23

## 2019-11-23 MED ORDER — ONDANSETRON 4 MG PO TBDP
4.00 | ORAL_TABLET | ORAL | Status: DC
Start: ? — End: 2019-11-23

## 2019-11-23 MED ORDER — DSS 100 MG PO CAPS
100.00 | ORAL_CAPSULE | ORAL | Status: DC
Start: 2019-11-23 — End: 2019-11-23

## 2019-11-23 MED ORDER — BIOTENE DRY MOUTH MOISTURIZING MT SOLN
1.00 | OROMUCOSAL | Status: DC
Start: ? — End: 2019-11-23

## 2019-11-23 MED ORDER — GABAPENTIN 300 MG PO CAPS
900.00 | ORAL_CAPSULE | ORAL | Status: DC
Start: 2019-11-23 — End: 2019-11-23

## 2020-01-20 IMAGING — CR DG ABDOMEN 1V
2 series · 2 of 2 positions shown · non-contrast
Comparison: None.

CLINICAL DATA: Shortness of breath, weakness, hypertension

EXAM:
ABDOMEN - 1 VIEW

[abdomen kub (1 of 2)]
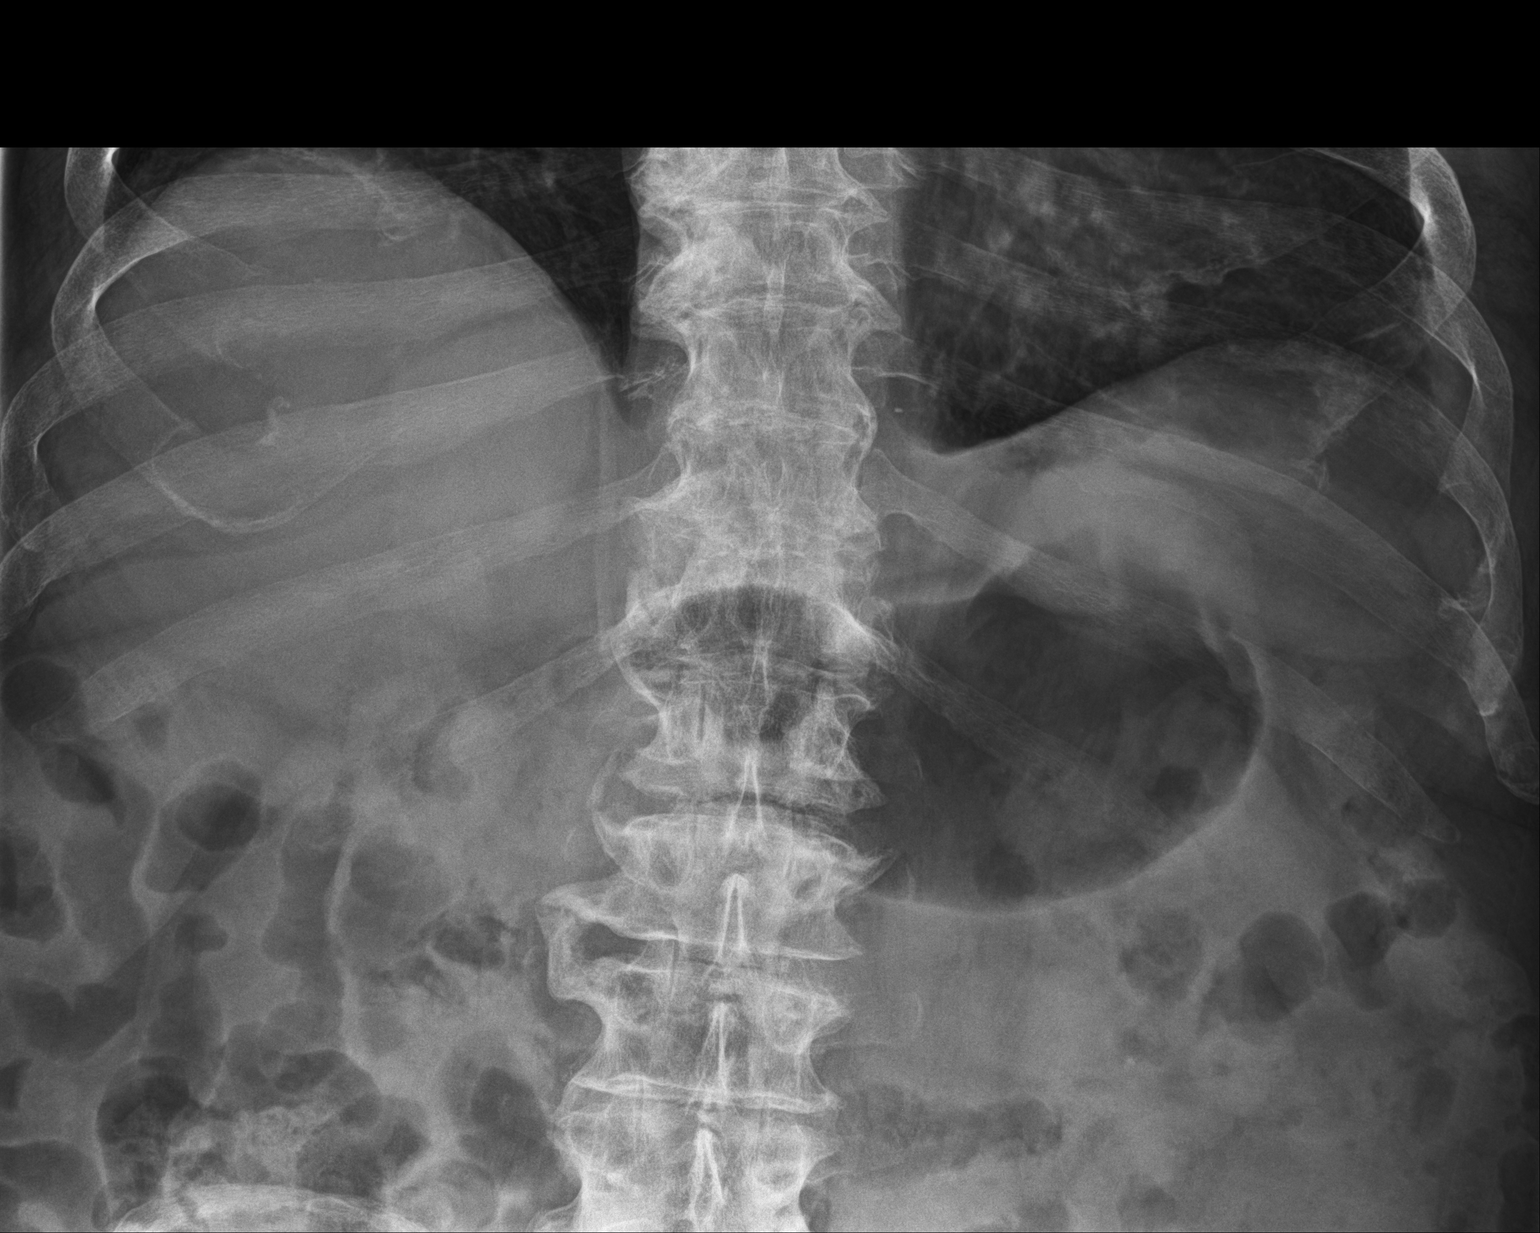

[abdomen kub (2 of 2)]
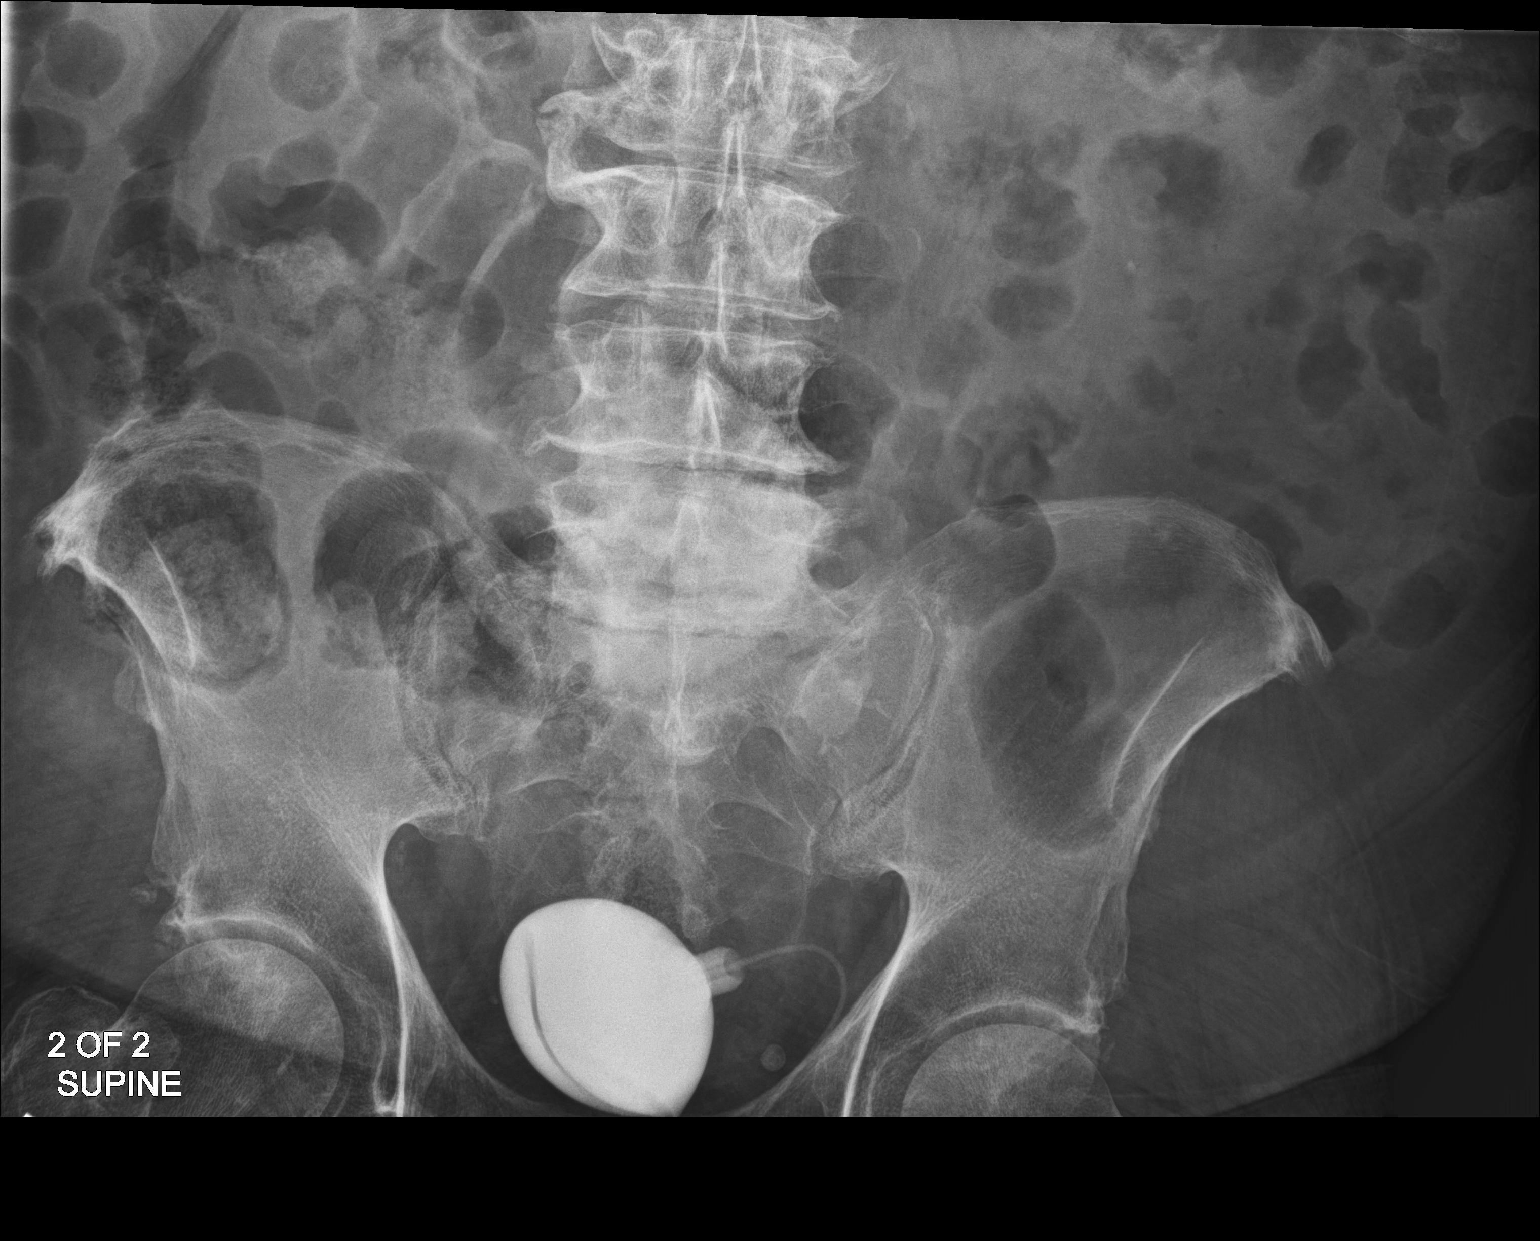

[2 of 2 positions shown; findings below may reference images not displayed]

FINDINGS: The bowel gas pattern is normal. Punctate calcification in the left
upper quadrant overlies the left renal shadow. Radiodense apparatus
in the pelvis, possibly reservoir for prosthesis. Additional
phleboliths are noted in the pelvis. Multilevel degenerative changes
are present in the imaged portions of the spine. Additional
degenerative features present in the SI joints and hips.
Enthesopathic changes noted on the iliac crests.
IMPRESSION: Nonobstructive bowel gas pattern.

Left nephrolithiasis.

Radiopaque reservoir in the low pelvis, correlate with patient
history.

## 2022-09-09 ENCOUNTER — Encounter: Payer: Self-pay | Admitting: Emergency Medicine

## 2022-09-09 ENCOUNTER — Ambulatory Visit
Admission: EM | Admit: 2022-09-09 | Discharge: 2022-09-09 | Disposition: A | Discharge status: Home / Self Care | Payer: Medicare HMO | Attending: Physician Assistant | Admitting: Physician Assistant

## 2022-09-09 DIAGNOSIS — I491 Atrial premature depolarization: Secondary | ICD-10-CM

## 2022-09-09 DIAGNOSIS — R001 Bradycardia, unspecified: Secondary | ICD-10-CM

## 2022-09-09 DIAGNOSIS — R531 Weakness: Secondary | ICD-10-CM | POA: Diagnosis not present

## 2022-09-09 DIAGNOSIS — R42 Dizziness and giddiness: Secondary | ICD-10-CM

## 2022-09-09 LAB — GLUCOSE, CAPILLARY: Glucose-Capillary: 83 mg/dL (ref 70–99)

## 2022-09-09 NOTE — ED Provider Notes (Signed)
[facility]    CSN: [id number] Arrival date & time: 09/09/22  1212      History   Chief Complaint Chief Complaint  Patient presents with   Dizziness    HPI Gregory Winters is a 67 y.o. male presenting for dizziness and generalized weakness for the past 2 days which is worsened today.  Patient says it feels like everything is spinning.  He also reports that it "feels like I cannot pick my feet up."  He is having difficulty with balance.  He denies falls or injuries.  He denies headache.  Reports occasional blurred vision.  Denies numbness/tingling, facial drooping, chest pain, chest tightness, palpitations, shortness of breath, abdominal pain, nausea or vomiting.  He denies any recent illnesses.  No fever, cough or congestion.  Patient denies any history of dizziness like this before.  He does have a history of type 2 diabetes and says that he has had some coffee today but not sure if he has eaten.  He also has a history of breast cancer but states he is in remission.  Additionally he has COPD and is a current every day smoker.  Other history includes obesity, chronic back pain, pulmonary embolism, hyperlipidemia.  Patient denies any drug use or alcohol.  No other concerns.  HPI  Past Medical History:  Diagnosis Date   B12 deficiency    History of   Chronic low back pain    Diabetes ([place])    History of vitamin D deficiency    Lymphedema of arm    right   Morbid obesity ([place])    Tobacco abuse     Patient Active Problem List   Diagnosis Date Noted   Breast mass, right 02/13/2017   Lumbar disc disease with radiculopathy 05/09/2016   Spinal stenosis of lumbar region 05/09/2016   Hyperlipidemia 05/09/2016   Morbid obesity with BMI of 45.0-49.9, adult ([place]) 12/12/2015   Chronic obstructive pulmonary disease ([place]) 12/12/2015   Cubital tunnel syndrome 12/12/2015   Cervical disc disease 12/12/2015   Nicotine dependence 01/01/2015   Carpal tunnel syndrome 12/31/2014    Diabetes mellitus, type 2 ([place]) 12/15/2014   Chronic low back pain 12/12/2014    Past Surgical History:  Procedure Laterality Date   NECK SURGERY  90's   WRIST SURGERY         Home Medications    Prior to Admission medications   Medication Sig Start Date End Date Taking? Authorizing Provider  albuterol (VENTOLIN HFA) 108 (90 Base) MCG/ACT inhaler Inhale 1-2 puffs into the lungs every 6 (six) hours as needed for wheezing or shortness of breath. 04/07/19   Coral Spikes, DO  apixaban (ELIQUIS) 5 MG TABS tablet Take by mouth. 04/10/19 05/16/19  [provider]  Blood Glucose Monitoring Suppl (ONE TOUCH ULTRA MINI) W/DEVICE KIT  12/14/14   [provider]  doxycycline (VIBRAMYCIN) 100 MG capsule Take 1 capsule (100 mg total) by mouth 2 (two) times daily. 05/12/19   Coral Spikes, DO  furosemide (LASIX) 20 MG tablet Take 1 tablet (20 mg total) by mouth daily. 05/10/19   Coral Spikes, DO  magnesium oxide (MAG-OX) 400 MG tablet Take by mouth.    [provider]  ONE TOUCH ULTRA TEST test strip  12/14/14   [provider]  polyethylene glycol powder (GLYCOLAX/MIRALAX) 17 GM/SCOOP powder 17 g once or twice daily for constipation. 04/07/19   Coral Spikes, DO  metFORMIN (GLUCOPHAGE) 500 MG tablet Take 500  mg by mouth 2 (two) times daily with a meal. 12/14/14 05/12/19  [provider]    Family History Family History  Problem Relation Age of Onset   Diabetes Sister    Cancer Other    Hypertension Other    Allergies Mother    Hyperlipidemia Mother    Clotting disorder Mother    Lung cancer Father    Hypertension Father     Social History Social History   Tobacco Use   Smoking status: Every Day    Packs/day: 1.00    Years: 45.00    Total pack years: 45.00    Types: Cigarettes   Smokeless tobacco: Former    Quit date: 05/11/2017  Vaping Use   Vaping Use: Never used  Substance Use Topics   Alcohol use: Not Currently    Alcohol/week: 0.0  - 1.0 standard drinks of alcohol   Drug use: Never     Allergies   Patient has no known allergies.   Review of Systems Review of Systems  Constitutional:  Positive for fatigue. Negative for chills, diaphoresis and fever.  HENT:  Negative for congestion, rhinorrhea and sore throat.   Eyes:  Positive for visual disturbance (blurred at times). Negative for photophobia.  Respiratory:  Negative for cough, chest tightness and shortness of breath.   Cardiovascular:  Negative for chest pain and palpitations.  Gastrointestinal:  Negative for abdominal pain, constipation, diarrhea, nausea and vomiting.  Genitourinary:  Negative for difficulty urinating and dysuria.  Musculoskeletal:  Negative for arthralgias and myalgias.  Neurological:  Positive for dizziness, speech difficulty (slow speech), weakness and light-headedness. Negative for tremors, seizures, syncope, facial asymmetry, numbness and headaches.  Psychiatric/Behavioral:  Negative for confusion.      Physical Exam Triage Vital Signs ED Triage Vitals [09/09/22 1224]  Enc Vitals Group     BP 138/70     Pulse Rate (!) 54     Resp      Temp      Temp src      SpO2 92 %     Weight      Height      Head Circumference      Peak Flow      Pain Score      Pain Loc      Pain Edu?      Excl. in [place]?    No data found.  Updated Vital Signs BP 138/70 (BP Location: Left Arm)   Pulse (!) 54   SpO2 92%      Physical Exam Vitals and nursing note reviewed.  Constitutional:      General: He is not in acute distress.    Appearance: He is well-developed. He is not ill-appearing.  HENT:     Head: Normocephalic and atraumatic.     Nose: Nose normal.     Mouth/Throat:     Mouth: Mucous membranes are moist.     Pharynx: Oropharynx is clear.  Eyes:     General: No scleral icterus.    Extraocular Movements: Extraocular movements intact.     Conjunctiva/sclera: Conjunctivae normal.     Pupils: Pupils are equal, round, and reactive  to light.  Cardiovascular:     Rate and Rhythm: Regular rhythm. Bradycardia present.     Heart sounds: Normal heart sounds.  Pulmonary:     Effort: Pulmonary effort is normal. No respiratory distress.     Breath sounds: Normal breath sounds.  Abdominal:     Palpations: Abdomen is  soft.     Tenderness: There is no abdominal tenderness.     Hernia: A hernia (large umbilical hernia) is present.  Musculoskeletal:     Cervical back: Neck supple.  Skin:    General: Skin is warm and dry.     Capillary Refill: Capillary refill takes less than 2 seconds.  Neurological:     General: No focal deficit present.     Mental Status: He is alert and oriented to person, place, and time.     Cranial Nerves: No cranial nerve deficit.     Motor: Weakness (generalized) present.     Coordination: Coordination normal.     Gait: Gait abnormal (slow, difficulty walking).  Psychiatric:        Mood and Affect: Mood is depressed.        Speech: Speech is delayed.        Behavior: Behavior is slowed. Behavior is cooperative.      UC Treatments / Results  Labs (all labs ordered are listed, but only abnormal results are displayed) Labs Reviewed  GLUCOSE, CAPILLARY  CBG MONITORING, ED    EKG   Radiology No results found.  Procedures ED EKG  Date/Time: 09/09/2022 12:51 PM  Performed by: Danton Clap, PA-C Authorized by: Danton Clap, PA-C   Previous ECG:    Previous ECG:  Unavailable Interpretation:    Interpretation: abnormal   Rate:    ECG rate:  52   ECG rate assessment: bradycardic   Rhythm:    Rhythm: sinus rhythm   Ectopy:    Ectopy: PAC   QRS:    QRS axis:  Normal   QRS intervals:  Normal   QRS conduction: normal   ST segments:    ST segments:  Normal T waves:    T waves: non-specific   Other findings:    Other findings: prolonged qTc interval   Comments:     Sinus bradycardia with T wave abnormality, PACs, prolonged QT  (including critical care  time)  Medications Ordered in UC Medications - No data to display  Initial Impression / Assessment and Plan / UC Course  I have reviewed the triage vital signs and the nursing notes.  Pertinent labs & imaging results that were available during my care of the patient were reviewed by me and considered in my medical decision making (see chart for details).   67 year old male with significant medical history for breast cancer in remission, PE, hyperlipidemia, COPD, type 2 diabetes and tobacco abuse presents for dizziness and generalized weakness since yesterday which is worsened today.  His speech is slow and his movements are slow.  He denies headaches, facial drooping, numbness/tingling, confusion, chest pain, palpitations, shortness of breath, abdominal pain, nausea/vomiting.  No recent illnesses or fever.  He has never had dizziness like this before.  No history of vertigo.  No new medications or alcohol use.  No falls or injuries.  Pulse rate is 54 bpm.  Oxygen is 92%.  He is in no respiratory distress.  Blood pressure 138/70.  Unable to obtain temperature with 2 different thermometers.  Patient is fatigued looking and has generalized weakness.  Normal cranial nerve exam.  He is alert and oriented x 3.  His speech is slow.  He has difficulty getting out of the wheelchair and onto the exam bed.  He requires assistance of 2 healthcare workers and has difficulty even then.  He understands commands and is speaking and answering questions slowly.  Chest is  clear to auscultation and heart regular rhythm.  Large umbilical hernia without any tenderness.  Fingerstick glucose is 83.  EKG performed today shows sinus bradycardia with PACs, prolonged QT interval and abnormal T waves.  No old EKGs to compare to.  Unsure as to the cause of patient's dizziness and generalized weakness but he is having an arrhythmia that appears to be new and given his significant history of cancer, PE, diabetes, COPD, I have  advised further and immediate workup in the emergency department.  I did suggest travel via EMS but he declines.  He states that he wants to go to [facility].  Advised him that he may do so.  A friend brought him.  He is leaving in stable condition.  Again he had difficulty getting off the exam bed and into the wheelchair as well as into his friend's car but was able to do so.  And bound to [facility] ED at this time.   Final Clinical Impressions(s) / UC Diagnoses   Final diagnoses:  Dizziness  Bradycardia  Premature atrial complexes  Weakness     Discharge Instructions      You have been advised to follow up immediately in the emergency department for concerning signs.symptoms. If you declined EMS transport, please have a family member take you directly to the ED at this time. Do not delay. Based on concerns about condition, if you do not follow up in th e ED, you may risk poor outcomes including worsening of condition, delayed treatment and potentially life threatening issues. If you have declined to go to the ED at this time, you should call your PCP immediately to set up a follow up appointment.  Go to ED for red flag symptoms, including; fevers you cannot reduce with Tylenol/Motrin, severe headaches, vision changes, numbness/weakness in part of the body, lethargy, confusion, intractable vomiting, severe dehydration, chest pain, breathing difficulty, severe persistent abdominal or pelvic pain, signs of severe infection (increased redness, swelling of an area), feeling faint or passing out, dizziness, etc. You should especially go to the ED for sudden acute worsening of condition if you do not elect to go at this time.     ED Prescriptions   None    PDMP not reviewed this encounter.   Danton Clap, PA-C 09/09/22 1256

## 2022-09-09 NOTE — ED Notes (Signed)
Assisted to truck with wheelchair.  Patient is riding with his cousin, both state they are going directly to ED.  Patient transferred to truck seat easier than transfers from wheelchair to exam table and back.

## 2022-09-09 NOTE — Discharge Instructions (Signed)

## 2022-09-09 NOTE — ED Triage Notes (Addendum)
Pt c/o dizziness. Started yesterday. Denies chest pain, palpitations, or shortness of breath. Pt states at registation that he feels like he is going to pass out. Pt seems like he is having trouble speaking, unsure if this is baseline. He states he was afraid to drive so someone brought him to UC.

## 2022-09-09 NOTE — ED Notes (Signed)
Patient is being discharged from the Urgent Care and sent to the Emergency Department via POV . Per Christene Slates, PA, patient is in need of higher level of care due to Lightheadedness, and abnormal EKG. Patient is aware and verbalizes understanding of plan of care.  Vitals:   09/09/22 1224  BP: 138/70  Pulse: (!) 54  SpO2: 92%

## 2022-09-09 NOTE — ED Notes (Signed)
Patient is weak, but moves all extremities.  Answers questions, oriented x 4, but asks repetitive questions.  Patient reports feeling weak.  Patient was very firm he wanted to go to [place] for health care.  Unable to get oral temp, provider aware.  Patient has been chewing gum the entire visit.
